# Patient Record
Sex: Male | Born: 1975 | Race: White | Hispanic: No | Marital: Single | Smoking: Current every day smoker
Health system: Southern US, Community
[De-identification: ages and names within clinical notes are randomized; demographics above are authoritative.]

## PROBLEM LIST (undated history)

## (undated) DIAGNOSIS — B029 Zoster without complications: Secondary | ICD-10-CM

---

## 2004-05-30 ENCOUNTER — Emergency Department (HOSPITAL_COMMUNITY): Admission: EM | Admit: 2004-05-30 | Discharge: 2004-05-30 | Payer: Self-pay | Admitting: Emergency Medicine

## 2022-03-30 ENCOUNTER — Ambulatory Visit (INDEPENDENT_AMBULATORY_CARE_PROVIDER_SITE_OTHER): Payer: Managed Care, Other (non HMO)

## 2022-03-30 ENCOUNTER — Ambulatory Visit
Admission: EM | Admit: 2022-03-30 | Discharge: 2022-03-30 | Disposition: A | Discharge status: Home / Self Care | Payer: Managed Care, Other (non HMO) | Attending: Family Medicine | Admitting: Family Medicine

## 2022-03-30 DIAGNOSIS — S92415A Nondisplaced fracture of proximal phalanx of left great toe, initial encounter for closed fracture: Secondary | ICD-10-CM | POA: Diagnosis not present
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
## 2022-03-30 NOTE — Discharge Instructions (Signed)
[facility] Address: [address], [place], [place] [address] Phone: [phone] 
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
## 2022-04-10 ENCOUNTER — Ambulatory Visit (INDEPENDENT_AMBULATORY_CARE_PROVIDER_SITE_OTHER): Payer: Managed Care, Other (non HMO) | Admitting: Podiatry

## 2022-04-10 DIAGNOSIS — S92425A Nondisplaced fracture of distal phalanx of left great toe, initial encounter for closed fracture: Secondary | ICD-10-CM | POA: Diagnosis not present
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
## 2022-09-02 ENCOUNTER — Emergency Department (HOSPITAL_COMMUNITY)
Admission: EM | Admit: 2022-09-02 | Discharge: 2022-09-02 | Disposition: A | Discharge status: Home / Self Care | Payer: Managed Care, Other (non HMO) | Attending: Emergency Medicine | Admitting: Emergency Medicine

## 2022-09-02 ENCOUNTER — Other Ambulatory Visit: Payer: Self-pay

## 2022-09-02 ENCOUNTER — Encounter (HOSPITAL_COMMUNITY): Payer: Self-pay

## 2022-09-02 DIAGNOSIS — T18108A Unspecified foreign body in esophagus causing other injury, initial encounter: Secondary | ICD-10-CM

## 2022-09-02 DIAGNOSIS — X58XXXA Exposure to other specified factors, initial encounter: Secondary | ICD-10-CM | POA: Diagnosis not present

## 2022-09-02 DIAGNOSIS — T18128A Food in esophagus causing other injury, initial encounter: Secondary | ICD-10-CM | POA: Insufficient documentation

## 2022-09-02 HISTORY — DX: Zoster without complications: B02.9

## 2022-09-02 NOTE — ED Triage Notes (Addendum)
Pt from home, reports eating pork chop about 45 minutes ago, says feels like it is stuck in esophagus, has been unable to swallow water or secretions, pt able to speak full sentences, breathing is WNL.  Pt also reports shingles to left arm x one week, currently on medication for this- lesions are crusted over, no weeping

## 2022-09-02 NOTE — ED Provider Notes (Signed)
  [facility] EMERGENCY DEPARTMENT Provider Note   CSN: [id number] Arrival date & time: 09/02/22  1911     History {Add pertinent medical, surgical, social history, OB history to HPI:1} Chief Complaint  Patient presents with   food bolus in throat    Rodney Norton is a 46 y.o. male.  Patient states he was eating pork and it got stuck in his throat.   Sore Throat       Home Medications Prior to Admission medications   Not on File      Allergies    Patient has no known allergies.    Review of Systems   Review of Systems  Physical Exam Updated Vital Signs BP 130/63 (BP Location: Right Arm)   Pulse 86   Temp 97.9 F (36.6 C)   Resp 18   Ht 6\' 1"  (1.854 m)   Wt 104.3 kg   SpO2 100%   BMI 30.34 kg/m  Physical Exam  ED Results / Procedures / Treatments   Labs (all labs ordered are listed, but only abnormal results are displayed) Labs Reviewed - No data to display  EKG None  Radiology No results found.  Procedures Procedures  {Document cardiac monitor, telemetry assessment procedure when appropriate:1}  Medications Ordered in ED Medications - No data to display  ED Course/ Medical Decision Making/ A&P  Patient had a foreign body sensation but when I initially went in to see him he stated that he felt like it just passed through his esophagus.  I gave him some water to drink and he was better.                         Medical Decision Making Patient with food bolus stuck in esophagus that has relieved itself.  He will follow-up with GI  {Document critical care time when appropriate:1} {Document review of labs and clinical decision tools ie heart score, Chads2Vasc2 etc:1}  {Document your independent review of radiology images, and any outside records:1} {Document your discussion with family members, caretakers, and with consultants:1} {Document social determinants of health affecting pt's care:1} {Document your decision making why or why not  admission, treatments were needed:1} Final Clinical Impression(s) / ED Diagnoses Final diagnoses:  None    Rx / DC Orders ED Discharge Orders     None

## 2022-09-02 NOTE — Discharge Instructions (Signed)
Take liquids only the rest of today.  If you are able to swallow that without problems and you can eat soft foods tomorrow and then progressed to normal foods the day after.  Make an appointment to follow-up with the stomach doctor because you may need to get a dilatation done

## 2023-03-21 IMAGING — DX DG FOOT COMPLETE 3+V*L*
3 series · 3 of 3 positions shown · non-contrast
Comparison: None.

CLINICAL DATA: Left first toe injury yesterday.

EXAM:
LEFT FOOT - COMPLETE 3+ VIEW

[foot ap]
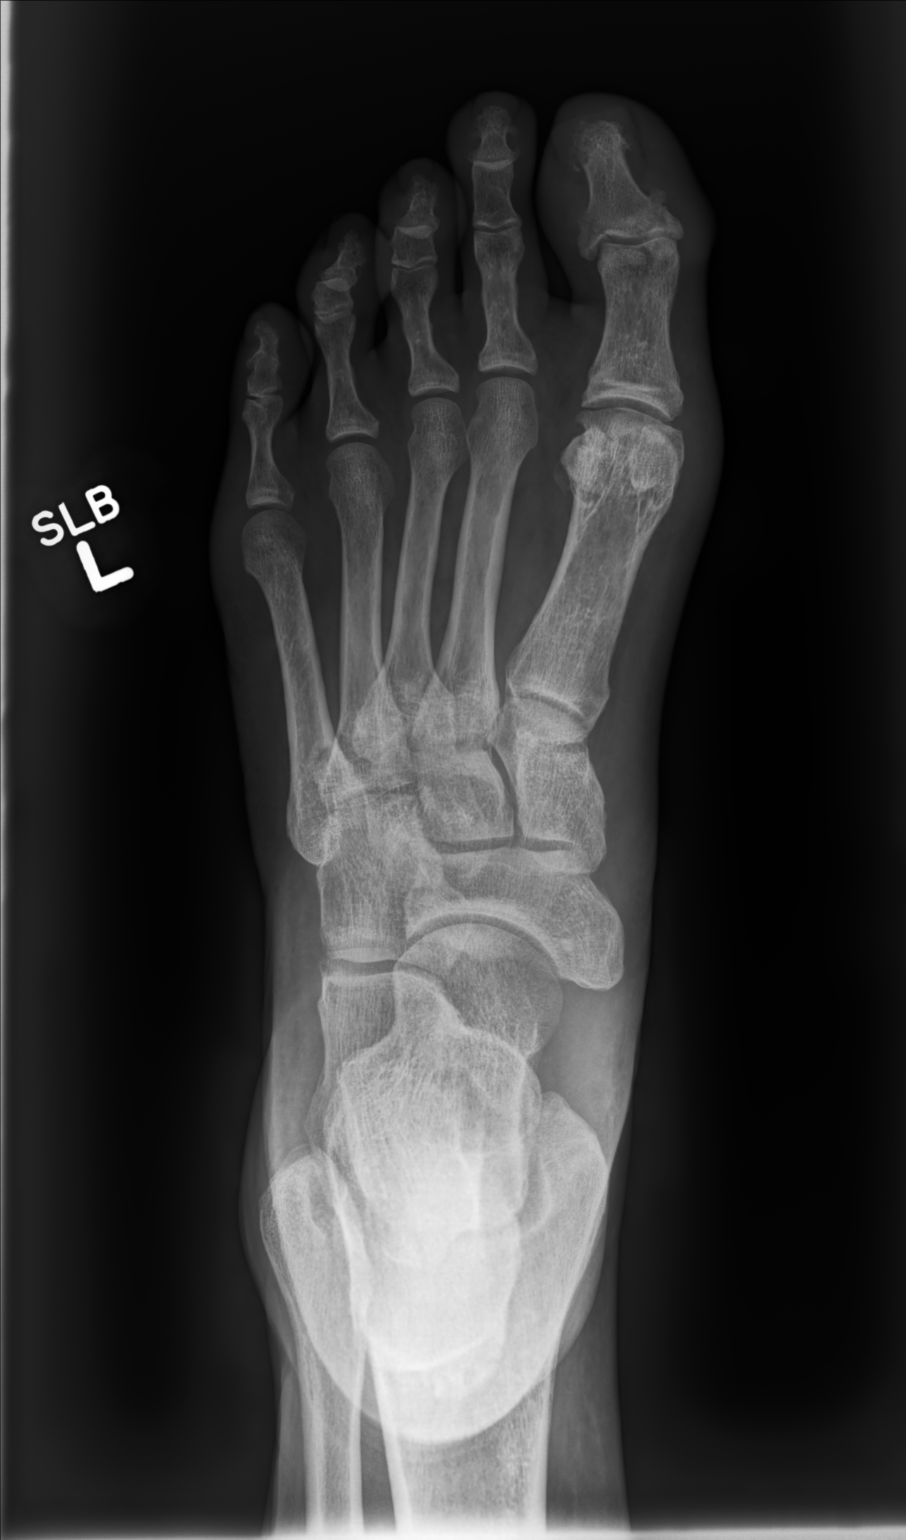

[foot mlo]
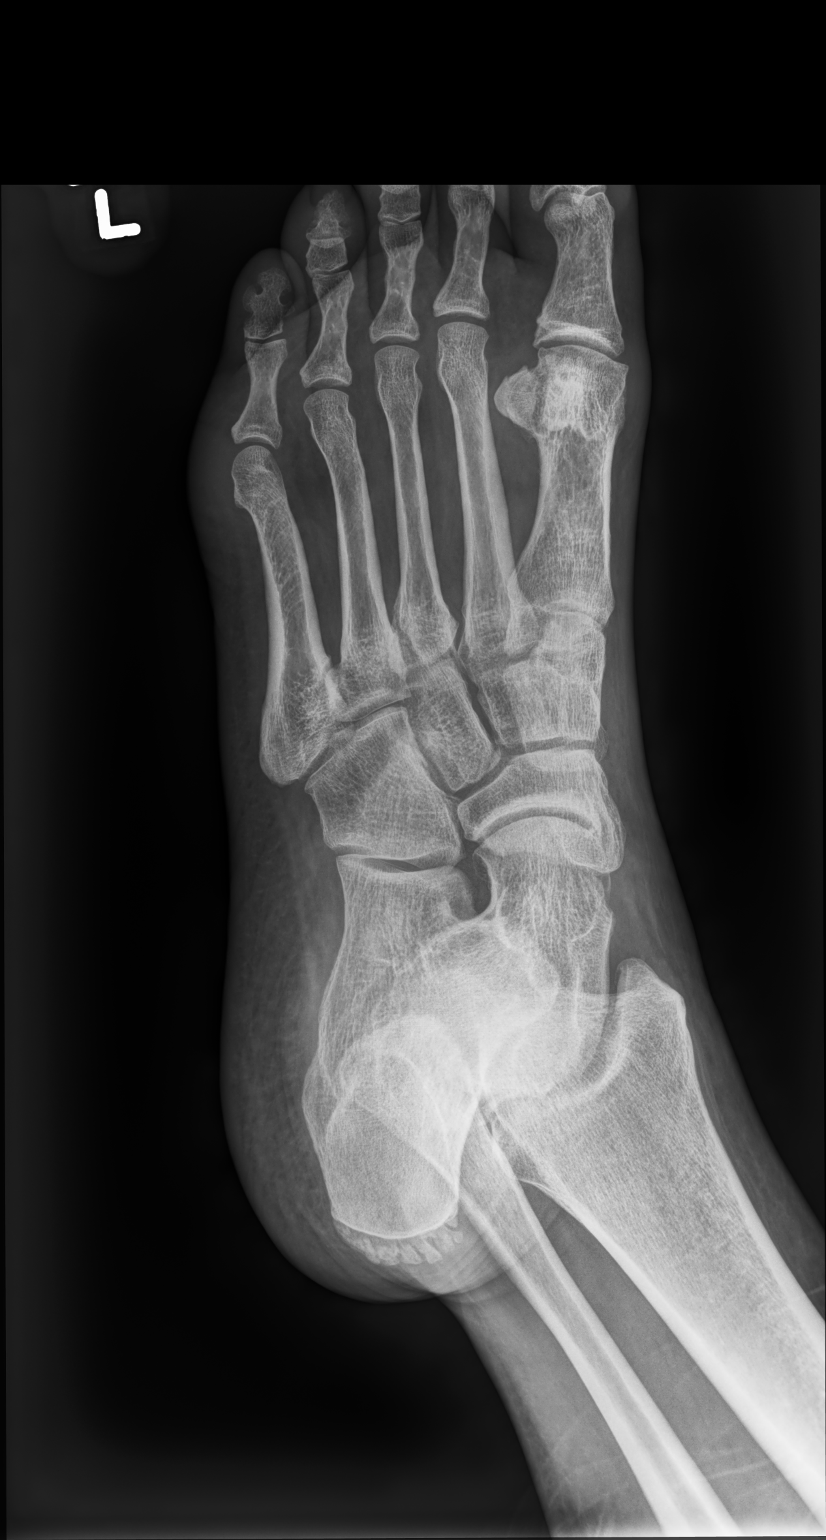

[foot lat]
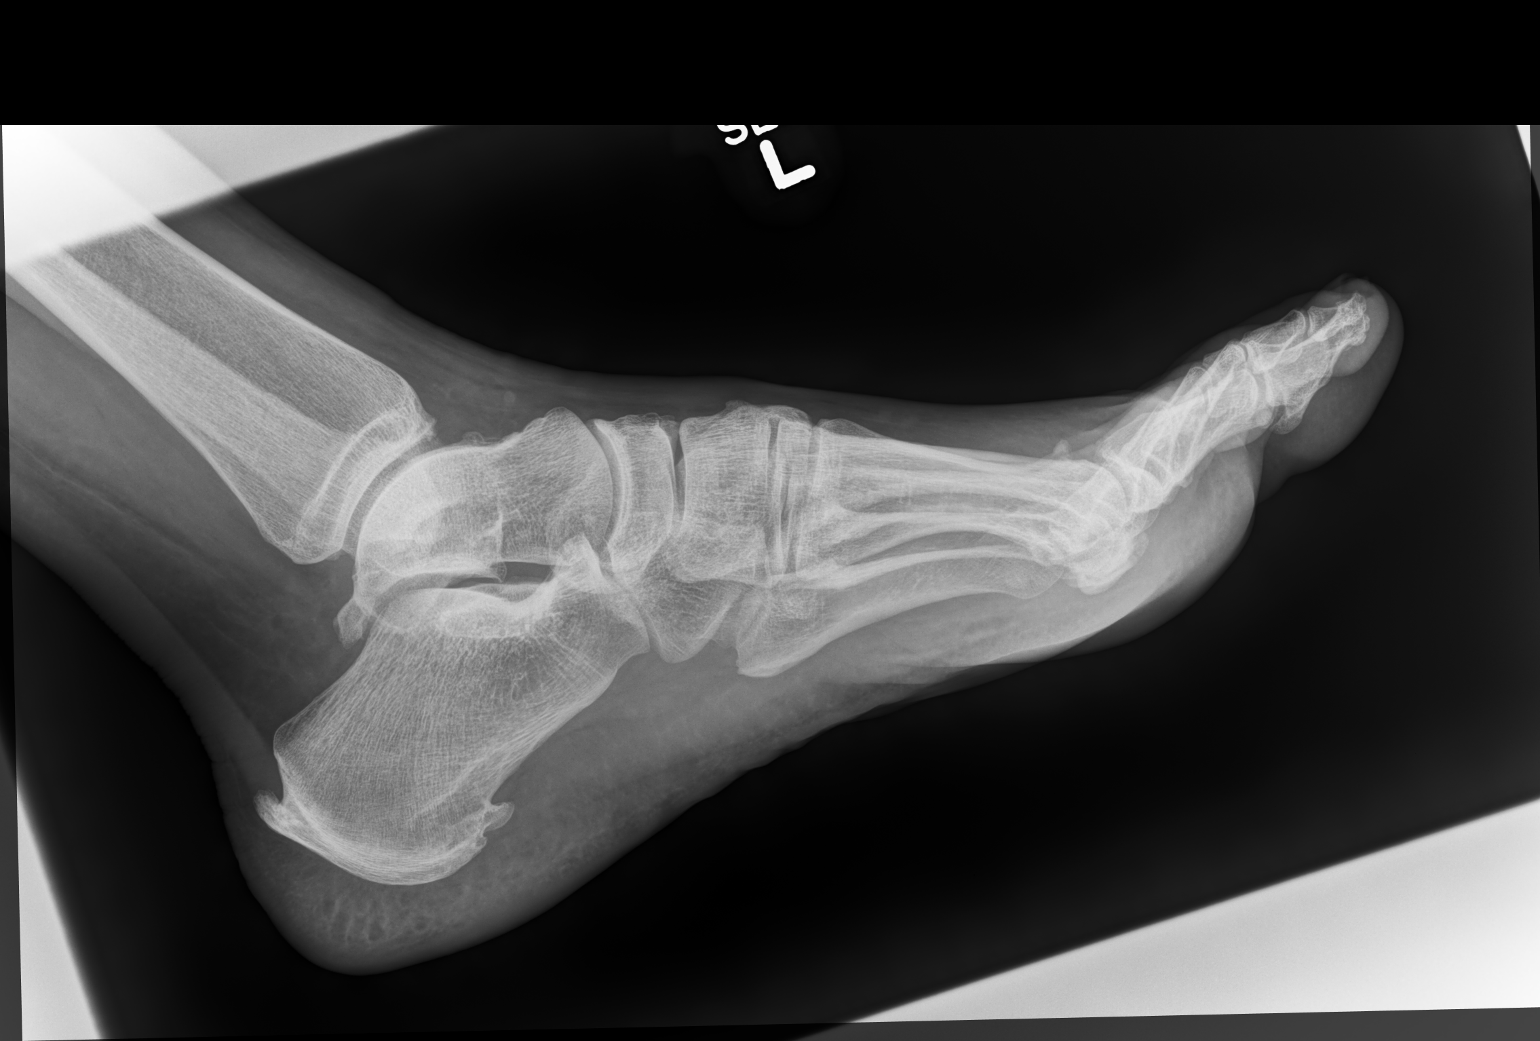

[3 of 3 positions shown; findings below may reference images not displayed]

FINDINGS: Nondisplaced fracture along the lateral base of the first distal
phalanx. Injury to the medial aspect of the base of the first distal
phalanx as well as first distal tuft is possible. Mild degenerate
change over the first MTP joint. Mild degenerate change over the
midfoot and hindfoot. Inferior calcaneal spurring. Associated soft
tissue swelling.
IMPRESSION: Nondisplaced fracture along the lateral base of the first distal
phalanx. Injury to the medial base of the first distal phalanx as
well as first distal tuft is possible.
